# Patient Record
Sex: Female | Born: 1965 | Race: Black or African American | Hispanic: No | Marital: Married | Smoking: Never smoker
Health system: Southern US, Community
[De-identification: ages and names within clinical notes are randomized; demographics above are authoritative.]

## PROBLEM LIST (undated history)

## (undated) DIAGNOSIS — I10 Essential (primary) hypertension: Secondary | ICD-10-CM

## (undated) HISTORY — PX: CHOLECYSTECTOMY: SHX55

## (undated) HISTORY — PX: ABDOMINAL SURGERY: SHX537

---

## 2008-10-24 ENCOUNTER — Emergency Department (HOSPITAL_BASED_OUTPATIENT_CLINIC_OR_DEPARTMENT_OTHER): Admission: EM | Admit: 2008-10-24 | Discharge: 2008-10-24 | Payer: Self-pay | Admitting: Emergency Medicine

## 2010-07-19 ENCOUNTER — Other Ambulatory Visit (HOSPITAL_COMMUNITY): Payer: Self-pay | Admitting: Specialist

## 2010-07-19 DIAGNOSIS — M79641 Pain in right hand: Secondary | ICD-10-CM

## 2010-07-31 ENCOUNTER — Other Ambulatory Visit (HOSPITAL_COMMUNITY): Payer: Self-pay

## 2010-07-31 ENCOUNTER — Ambulatory Visit (HOSPITAL_COMMUNITY)
Admission: RE | Admit: 2010-07-31 | Discharge: 2010-07-31 | Disposition: A | Discharge status: Home / Self Care | Payer: 59 | Source: Ambulatory Visit | Attending: Specialist | Admitting: Specialist

## 2010-07-31 DIAGNOSIS — M79641 Pain in right hand: Secondary | ICD-10-CM

## 2010-07-31 DIAGNOSIS — M19049 Primary osteoarthritis, unspecified hand: Secondary | ICD-10-CM | POA: Insufficient documentation

## 2010-07-31 DIAGNOSIS — M6740 Ganglion, unspecified site: Secondary | ICD-10-CM | POA: Insufficient documentation

## 2010-07-31 DIAGNOSIS — M25549 Pain in joints of unspecified hand: Secondary | ICD-10-CM | POA: Insufficient documentation

## 2010-09-24 LAB — DIFFERENTIAL
Basophils Absolute: 0.1 10*3/uL (ref 0.0–0.1)
Basophils Relative: 1 % (ref 0–1)
Eosinophils Absolute: 0 10*3/uL (ref 0.0–0.7)
Eosinophils Relative: 0 % (ref 0–5)
Lymphocytes Relative: 11 % — ABNORMAL LOW (ref 12–46)

## 2010-09-24 LAB — COMPREHENSIVE METABOLIC PANEL
ALT: 22 U/L (ref 0–35)
AST: 28 U/L (ref 0–37)
Alkaline Phosphatase: 156 U/L — ABNORMAL HIGH (ref 39–117)
CO2: 31 mEq/L (ref 19–32)
Chloride: 98 mEq/L (ref 96–112)
Creatinine, Ser: 1 mg/dL (ref 0.4–1.2)
GFR calc Af Amer: >60 mL/min (ref >60)
GFR calc non Af Amer: >60 mL/min (ref >60)
Total Bilirubin: 0.6 mg/dL (ref 0.3–1.2)

## 2010-09-24 LAB — CBC
HCT: 41.1 % (ref 36.0–46.0)
MCV: 83.9 fL (ref 78.0–100.0)
RBC: 4.9 MIL/uL (ref 3.87–5.11)
WBC: 8.1 10*3/uL (ref 4.0–10.5)

## 2012-10-03 ENCOUNTER — Emergency Department (HOSPITAL_BASED_OUTPATIENT_CLINIC_OR_DEPARTMENT_OTHER)
Admission: EM | Admit: 2012-10-03 | Discharge: 2012-10-03 | Disposition: A | Discharge status: Home / Self Care | Payer: BC Managed Care – PPO | Attending: Emergency Medicine | Admitting: Emergency Medicine

## 2012-10-03 ENCOUNTER — Encounter (HOSPITAL_BASED_OUTPATIENT_CLINIC_OR_DEPARTMENT_OTHER): Payer: Self-pay | Admitting: *Deleted

## 2012-10-03 DIAGNOSIS — R197 Diarrhea, unspecified: Secondary | ICD-10-CM | POA: Insufficient documentation

## 2012-10-03 DIAGNOSIS — B9789 Other viral agents as the cause of diseases classified elsewhere: Secondary | ICD-10-CM | POA: Insufficient documentation

## 2012-10-03 DIAGNOSIS — Z3202 Encounter for pregnancy test, result negative: Secondary | ICD-10-CM | POA: Insufficient documentation

## 2012-10-03 DIAGNOSIS — I10 Essential (primary) hypertension: Secondary | ICD-10-CM | POA: Insufficient documentation

## 2012-10-03 DIAGNOSIS — Z9889 Other specified postprocedural states: Secondary | ICD-10-CM | POA: Insufficient documentation

## 2012-10-03 DIAGNOSIS — Z9089 Acquired absence of other organs: Secondary | ICD-10-CM | POA: Insufficient documentation

## 2012-10-03 DIAGNOSIS — Z79899 Other long term (current) drug therapy: Secondary | ICD-10-CM | POA: Insufficient documentation

## 2012-10-03 DIAGNOSIS — B349 Viral infection, unspecified: Secondary | ICD-10-CM

## 2012-10-03 DIAGNOSIS — R1084 Generalized abdominal pain: Secondary | ICD-10-CM | POA: Insufficient documentation

## 2012-10-03 HISTORY — DX: Essential (primary) hypertension: I10

## 2012-10-03 LAB — CBC WITH DIFFERENTIAL/PLATELET
Eosinophils Relative: 0 % (ref 0–5)
HCT: 35.5 % — ABNORMAL LOW (ref 36.0–46.0)
Hemoglobin: 12.2 g/dL (ref 12.0–15.0)
Lymphocytes Relative: 5 % — ABNORMAL LOW (ref 12–46)
Lymphs Abs: 0.6 10*3/uL — ABNORMAL LOW (ref 0.7–4.0)
MCV: 82.6 fL (ref 78.0–100.0)
Monocytes Absolute: 0.3 10*3/uL (ref 0.1–1.0)
Monocytes Relative: 3 % (ref 3–12)
RBC: 4.3 MIL/uL (ref 3.87–5.11)
RDW: 13.9 % (ref 11.5–15.5)
WBC: 12.1 10*3/uL — ABNORMAL HIGH (ref 4.0–10.5)

## 2012-10-03 LAB — URINALYSIS, ROUTINE W REFLEX MICROSCOPIC
Glucose, UA: NEGATIVE mg/dL
Protein, ur: NEGATIVE mg/dL

## 2012-10-03 LAB — COMPREHENSIVE METABOLIC PANEL
BUN: 10 mg/dL (ref 6–23)
CO2: 29 mEq/L (ref 19–32)
Calcium: 9.5 mg/dL (ref 8.4–10.5)
Creatinine, Ser: 0.9 mg/dL (ref 0.50–1.10)
GFR calc Af Amer: 88 mL/min — ABNORMAL LOW (ref >90)
GFR calc non Af Amer: 76 mL/min — ABNORMAL LOW (ref >90)
Glucose, Bld: 138 mg/dL — ABNORMAL HIGH (ref 70–99)

## 2012-10-03 LAB — URINE MICROSCOPIC-ADD ON

## 2012-10-03 MED ORDER — ONDANSETRON 8 MG PO TBDP
8.0000 mg | ORAL_TABLET | Freq: Once | ORAL | Status: AC
  Administered 2012-10-03: 8 mg via ORAL
  Filled 2012-10-03: qty 1

## 2012-10-03 MED ORDER — ONDANSETRON HCL 4 MG/2ML IJ SOLN
INTRAMUSCULAR | Status: AC
  Administered 2012-10-03: 4 mg via INTRAVENOUS
  Filled 2012-10-03: qty 2

## 2012-10-03 MED ORDER — ONDANSETRON HCL 4 MG/2ML IJ SOLN
4.0000 mg | Freq: Once | INTRAMUSCULAR | Status: AC
  Administered 2012-10-03: 4 mg via INTRAVENOUS

## 2012-10-03 MED ORDER — MORPHINE SULFATE 4 MG/ML IJ SOLN: 4.0000 mg | Freq: Once | INTRAMUSCULAR | Status: DC

## 2012-10-03 MED ORDER — DIPHENOXYLATE-ATROPINE 2.5-0.025 MG PO TABS
2.0000 | ORAL_TABLET | ORAL | Status: AC
  Administered 2012-10-03: 2 via ORAL
  Filled 2012-10-03: qty 2

## 2012-10-03 MED ORDER — SODIUM CHLORIDE 0.9 % IV SOLN
1000.0000 mL | Freq: Once | INTRAVENOUS | Status: AC
Start: 2012-10-03 — End: 2012-10-03
  Administered 2012-10-03: 1000 mL via INTRAVENOUS

## 2012-10-03 MED ORDER — PROMETHAZINE HCL 25 MG/ML IJ SOLN
25.0000 mg | Freq: Once | INTRAMUSCULAR | Status: AC
  Administered 2012-10-03: 25 mg via INTRAVENOUS
  Filled 2012-10-03: qty 1

## 2012-10-03 MED ORDER — DIPHENOXYLATE-ATROPINE 2.5-0.025 MG PO TABS: 1.0000 | ORAL_TABLET | Freq: Four times a day (QID) | ORAL | Status: AC | PRN

## 2012-10-03 MED ORDER — SODIUM CHLORIDE 0.9 % IV BOLUS (SEPSIS): 1000.0000 mL | Freq: Once | INTRAVENOUS | Status: DC

## 2012-10-03 MED ORDER — DIPHENOXYLATE-ATROPINE 2.5-0.025 MG PO TABS
ORAL_TABLET | ORAL | Status: AC
  Filled 2012-10-03: qty 1

## 2012-10-03 MED ORDER — PROMETHAZINE HCL 25 MG PO TABS: 25.0000 mg | ORAL_TABLET | Freq: Four times a day (QID) | ORAL | Status: AC | PRN

## 2012-10-03 NOTE — ED Provider Notes (Signed)
History     CSN: [id number]  Arrival date & time 10/03/12  1712   First MD Initiated Contact with Patient 10/03/12 1847      Chief Complaint  Patient presents with  . Emesis    (Consider location/radiation/quality/duration/timing/severity/associated sxs/prior treatment) HPI Comments: Patient is a 47 year old female who presents with a 1 day history of abdominal pain. The pain is located in her generalized abdomen and does not radiate. The pain is described as cramping and moderate. The pain started gradually and progressively worsened since the onset. No alleviating/aggravating factors. The patient has tried nothing for symptoms without relief. Associated symptoms include NVD. Patient denies fever, headache, chest pain, SOB, dysuria, constipation, abnormal vaginal bleeding/discharge.     Patient is a 47 y.o. female presenting with vomiting.  Emesis Associated symptoms: abdominal pain and diarrhea     Past Medical History  Diagnosis Date  . Hypertension     Past Surgical History  Procedure Laterality Date  . Cholecystectomy    . Abdominal surgery      History reviewed. No pertinent family history.  History  Substance Use Topics  . Smoking status: Never Smoker   . Smokeless tobacco: Not on file  . Alcohol Use: No    OB History   Grav Para Term Preterm Abortions TAB SAB Ect Mult Living                  Review of Systems  Gastrointestinal: Positive for nausea, vomiting, abdominal pain and diarrhea.  All other systems reviewed and are negative.    Allergies  Ace inhibitors  Home Medications   Current Outpatient Rx  Name  Route  Sig  Dispense  Refill  . amLODipine (NORVASC) 5 MG tablet   Oral   Take 5 mg by mouth daily.         . methyldopa (ALDOMET) 250 MG tablet   Oral   Take 250 mg by mouth 2 (two) times daily.           BP 163/79  Pulse 101  Temp(Src) 99.7 F (37.6 C) (Oral)  Resp 20  Ht 5\' 3"  (1.6 m)  Wt 200 lb (90.719 kg)  BMI 35.44  kg/m2  SpO2 97%  LMP 09/12/2012  Physical Exam  Nursing note and vitals reviewed. Constitutional: She is oriented to person, place, and time. She appears well-developed and well-nourished. No distress.  HENT:  Head: Normocephalic and atraumatic.  Eyes: Conjunctivae and EOM are normal. Pupils are equal, round, and reactive to light.  Neck: Normal range of motion.  Cardiovascular: Normal rate and regular rhythm.  Exam reveals no gallop and no friction rub.   No murmur heard. Pulmonary/Chest: Effort normal and breath sounds normal. She has no wheezes. She has no rales. She exhibits no tenderness.  Abdominal: Soft. She exhibits no distension. There is tenderness. There is no rebound and no guarding.  Mild generalized tenderness to palpation.   Musculoskeletal: Normal range of motion.  Neurological: She is alert and oriented to person, place, and time. Coordination normal.  Speech is goal-oriented. Moves limbs without ataxia.   Skin: Skin is warm and dry.  Psychiatric: She has a normal mood and affect. Her behavior is normal.    ED Course  Procedures (including critical care time)  Labs Reviewed  URINALYSIS, ROUTINE W REFLEX MICROSCOPIC - Abnormal; Notable for the following:    APPearance CLOUDY (*)    Hgb urine dipstick SMALL (*)    All other components within  normal limits  CBC WITH DIFFERENTIAL - Abnormal; Notable for the following:    WBC 12.1 (*)    HCT 35.5 (*)    Platelets 428 (*)    Neutrophils Relative 92 (*)    Neutro Abs 11.1 (*)    Lymphocytes Relative 5 (*)    Lymphs Abs 0.6 (*)    All other components within normal limits  COMPREHENSIVE METABOLIC PANEL - Abnormal; Notable for the following:    Potassium 3.1 (*)    Glucose, Bld 138 (*)    GFR calc non Af Amer 76 (*)    GFR calc Af Amer 88 (*)    All other components within normal limits  URINE MICROSCOPIC-ADD ON - Abnormal; Notable for the following:    Squamous Epithelial / LPF FEW (*)    Bacteria, UA FEW (*)     Casts HYALINE CASTS (*)    All other components within normal limits  PREGNANCY, URINE  LIPASE, BLOOD   No results found.   1. Viral illness       MDM  9:11 PM Labs and urinalysis unremarkable. Patient likely has a viral illness. Patient will be treated symptomatically with zofran, lomotil, and fluids. Vitals stable and patient is afebrile. Patient will be discharged with phenergan and lomotil for symptoms. Patient instructed to return with worsening or concerning symptoms.        Emilia Beck, [place] 10/03/12 2116

## 2012-10-03 NOTE — ED Notes (Signed)
Pt states her family member was sick with these s/s on Friday and today she began having N/V/D

## 2012-10-03 NOTE — ED Notes (Signed)
Pt states that she cannot provide a urine specimen at this time.  Will check back shortly.    

## 2012-10-04 NOTE — ED Provider Notes (Signed)
Medical screening examination/treatment/procedure(s) were performed by non-physician practitioner and as supervising physician I was immediately available for consultation/collaboration.   Ruger Saxer III, MD 10/04/12 1446 

## 2014-04-22 ENCOUNTER — Emergency Department (HOSPITAL_BASED_OUTPATIENT_CLINIC_OR_DEPARTMENT_OTHER): Payer: BC Managed Care – PPO

## 2014-04-22 ENCOUNTER — Emergency Department (HOSPITAL_BASED_OUTPATIENT_CLINIC_OR_DEPARTMENT_OTHER)
Admission: EM | Admit: 2014-04-22 | Discharge: 2014-04-22 | Disposition: A | Discharge status: Home / Self Care | Payer: BC Managed Care – PPO | Attending: Emergency Medicine | Admitting: Emergency Medicine

## 2014-04-22 ENCOUNTER — Encounter (HOSPITAL_BASED_OUTPATIENT_CLINIC_OR_DEPARTMENT_OTHER): Payer: Self-pay

## 2014-04-22 DIAGNOSIS — R1031 Right lower quadrant pain: Secondary | ICD-10-CM | POA: Diagnosis present

## 2014-04-22 DIAGNOSIS — N946 Dysmenorrhea, unspecified: Secondary | ICD-10-CM | POA: Insufficient documentation

## 2014-04-22 DIAGNOSIS — Z79899 Other long term (current) drug therapy: Secondary | ICD-10-CM | POA: Insufficient documentation

## 2014-04-22 DIAGNOSIS — I10 Essential (primary) hypertension: Secondary | ICD-10-CM | POA: Diagnosis not present

## 2014-04-22 DIAGNOSIS — Z3202 Encounter for pregnancy test, result negative: Secondary | ICD-10-CM | POA: Diagnosis not present

## 2014-04-22 DIAGNOSIS — Z9089 Acquired absence of other organs: Secondary | ICD-10-CM | POA: Diagnosis not present

## 2014-04-22 DIAGNOSIS — R102 Pelvic and perineal pain: Secondary | ICD-10-CM

## 2014-04-22 DIAGNOSIS — R109 Unspecified abdominal pain: Secondary | ICD-10-CM

## 2014-04-22 DIAGNOSIS — Z9889 Other specified postprocedural states: Secondary | ICD-10-CM | POA: Insufficient documentation

## 2014-04-22 LAB — CBC WITH DIFFERENTIAL/PLATELET
Basophils Absolute: 0.1 10*3/uL (ref 0.0–0.1)
Basophils Relative: 1 % (ref 0–1)
Eosinophils Absolute: 0.2 10*3/uL (ref 0.0–0.7)
Eosinophils Relative: 3 % (ref 0–5)
HCT: 32.5 % — ABNORMAL LOW (ref 36.0–46.0)
Hemoglobin: 11.2 g/dL — ABNORMAL LOW (ref 12.0–15.0)
Lymphocytes Relative: 51 % — ABNORMAL HIGH (ref 12–46)
Lymphs Abs: 2.9 10*3/uL (ref 0.7–4.0)
MCH: 28.9 pg (ref 26.0–34.0)
MCHC: 34.5 g/dL (ref 30.0–36.0)
MCV: 83.8 fL (ref 78.0–100.0)
Monocytes Absolute: 0.5 10*3/uL (ref 0.1–1.0)
Monocytes Relative: 8 % (ref 3–12)
Neutro Abs: 2.1 10*3/uL (ref 1.7–7.7)
Neutrophils Relative %: 37 % — ABNORMAL LOW (ref 43–77)
Platelets: 426 10*3/uL — ABNORMAL HIGH (ref 150–400)
RBC: 3.88 MIL/uL (ref 3.87–5.11)
RDW: 13.5 % (ref 11.5–15.5)
WBC: 5.6 10*3/uL (ref 4.0–10.5)

## 2014-04-22 LAB — URINE MICROSCOPIC-ADD ON

## 2014-04-22 LAB — COMPREHENSIVE METABOLIC PANEL
ALT: 14 U/L (ref 0–35)
ANION GAP: 12 (ref 5–15)
AST: 18 U/L (ref 0–37)
Albumin: 3.6 g/dL (ref 3.5–5.2)
Alkaline Phosphatase: 79 U/L (ref 39–117)
BUN: 12 mg/dL (ref 6–23)
CALCIUM: 9 mg/dL (ref 8.4–10.5)
CO2: 28 meq/L (ref 19–32)
CREATININE: 0.9 mg/dL (ref 0.50–1.10)
Chloride: 103 mEq/L (ref 96–112)
GFR, EST AFRICAN AMERICAN: 88 mL/min — AB (ref >90)
GFR, EST NON AFRICAN AMERICAN: 76 mL/min — AB (ref >90)
GLUCOSE: 105 mg/dL — AB (ref 70–99)
Potassium: 3.5 mEq/L — ABNORMAL LOW (ref 3.7–5.3)
Sodium: 143 mEq/L (ref 137–147)
Total Bilirubin: <0.2 mg/dL — ABNORMAL LOW (ref 0.3–1.2)
Total Protein: 7.4 g/dL (ref 6.0–8.3)

## 2014-04-22 LAB — URINALYSIS, ROUTINE W REFLEX MICROSCOPIC
Bilirubin Urine: NEGATIVE
GLUCOSE, UA: NEGATIVE mg/dL
KETONES UR: NEGATIVE mg/dL
LEUKOCYTES UA: NEGATIVE
Nitrite: NEGATIVE
PROTEIN: NEGATIVE mg/dL
Specific Gravity, Urine: 1.013 (ref 1.005–1.030)
UROBILINOGEN UA: 0.2 mg/dL (ref 0.0–1.0)
pH: 6.5 (ref 5.0–8.0)

## 2014-04-22 LAB — WET PREP, GENITAL
Trich, Wet Prep: NONE SEEN
YEAST WET PREP: NONE SEEN

## 2014-04-22 LAB — PREGNANCY, URINE: Preg Test, Ur: NEGATIVE

## 2014-04-22 MED ORDER — KETOROLAC TROMETHAMINE 30 MG/ML IJ SOLN
30.0000 mg | Freq: Once | INTRAMUSCULAR | Status: AC
  Administered 2014-04-22: 30 mg via INTRAVENOUS
  Filled 2014-04-22: qty 1

## 2014-04-22 MED ORDER — NAPROXEN 500 MG PO TABS: 500.0000 mg | ORAL_TABLET | Freq: Two times a day (BID) | ORAL | Status: AC

## 2014-04-22 MED ORDER — PROMETHAZINE HCL 25 MG/ML IJ SOLN
12.5000 mg | Freq: Once | INTRAMUSCULAR | Status: DC
  Filled 2014-04-22: qty 1

## 2014-04-22 NOTE — ED Notes (Signed)
RLQ pain since yesterday afternoon.  Chills, no fever. No N/V/D

## 2014-04-22 NOTE — ED Notes (Signed)
Pelvic cart is at the bedside set up and ready for the doctor to use. 

## 2014-04-22 NOTE — ED Notes (Signed)
Pt states she doesn't want Phenergan at this time. Danna HeftyGolden, Jeremiyah Cullens Lee, RN

## 2014-04-22 NOTE — ED Notes (Signed)
Waiting on pregnancy test results before doing cat scan.  Thanks

## 2014-04-22 NOTE — Discharge Instructions (Signed)
Follow up with your GYN 

## 2014-04-22 NOTE — ED Notes (Signed)
Waiting on pregnancy results for ct scan.  Thanks

## 2014-04-22 NOTE — ED Provider Notes (Signed)
CSN: [id number]     Arrival date & time 04/22/14  1330 History   First MD Initiated Contact with Patient 04/22/14 1521     Chief Complaint  Patient presents with  . Abdominal Pain     (Consider location/radiation/quality/duration/timing/severity/associated sxs/prior Treatment) Patient is a 48 y.o. female presenting with abdominal pain. The history is provided by the patient.  Abdominal Pain Pain location:  RLQ Pain quality: aching, sharp and stabbing   Pain radiates to:  R flank Pain severity:  Severe Onset quality:  Gradual Duration:  24 hours Timing:  Constant Progression:  Worsening Chronicity:  New Relieved by:  Nothing Worsened by:  Nothing tried  Shelly Pena is a 48 y.o. female who presents to the ED with RLQ abdominal pain that started yesterday. She states the pain started in her lower abdomen and now has increased and is in the RLQ. She denies n/v, fever or chills or other problems. The pain increases with ridding over bumps in the road or jumping. She denies UTI symptoms. Menses currently.  Past Medical History  Diagnosis Date  . Hypertension    Past Surgical History  Procedure Laterality Date  . Cholecystectomy    . Abdominal surgery     No family history on file. History  Substance Use Topics  . Smoking status: Never Smoker   . Smokeless tobacco: Not on file  . Alcohol Use: No   OB History    No data available     Review of Systems  Gastrointestinal: Positive for abdominal pain.  all other systems negative    Allergies  Ace inhibitors and Losartan  Home Medications   Prior to Admission medications   Medication Sig Start Date End Date Taking? Authorizing Provider  amLODipine (NORVASC) 5 MG tablet Take 5 mg by mouth daily.    Historical Provider, MD  diphenoxylate-atropine (LOMOTIL) 2.5-0.025 MG per tablet Take 1 tablet by mouth 4 (four) times daily as needed for diarrhea or loose stools. 10/03/12   Kaitlyn Szekalski, PA-C  methyldopa  (ALDOMET) 250 MG tablet Take 250 mg by mouth 2 (two) times daily.    Historical Provider, MD  promethazine (PHENERGAN) 25 MG tablet Take 1 tablet (25 mg total) by mouth every 6 (six) hours as needed for nausea. 10/03/12   Kaitlyn Szekalski, PA-C   BP 150/83 mmHg  Pulse 83  Temp(Src) 98.4 F (36.9 C) (Oral)  Resp 18  Ht 5\' 3"  (1.6 m)  Wt 209 lb (94.802 kg)  BMI 37.03 kg/m2  SpO2 99%  LMP 04/17/2014 Physical Exam  Constitutional: She is oriented to person, place, and time. She appears well-developed and well-nourished.  HENT:  Head: Normocephalic.  Eyes: EOM are normal.  Neck: Neck supple.  Cardiovascular: Normal rate.   Pulmonary/Chest: Effort normal.  Abdominal: Soft. Bowel sounds are normal. There is tenderness in the right lower quadrant. There is no rigidity, no rebound, no guarding and no CVA tenderness.  Genitourinary:  External genitalia without lesions, moderate blood vaginal vault. Mild CMT, right adnexal tenderness, uterus without palpable enlargement.   Musculoskeletal: Normal range of motion.  Neurological: She is alert and oriented to person, place, and time. No cranial nerve deficit.  Skin: Skin is warm and dry.  Psychiatric: She has a normal mood and affect. Her behavior is normal.  Nursing note and vitals reviewed.   ED Course  Procedures (including critical care time) Labs Review Patient has vaginal bleeding that most likely reason for positive blood in urine.  Results for orders  placed or performed during the hospital encounter of 04/22/14 (from the past 24 hour(s))  Urinalysis with microscopic     Status: Abnormal   Collection Time: 04/22/14  2:02 PM  Result Value Ref Range   Color, Urine YELLOW YELLOW   APPearance CLEAR CLEAR   Specific Gravity, Urine 1.013 1.005 - 1.030   pH 6.5 5.0 - 8.0   Glucose, UA NEGATIVE NEGATIVE mg/dL   Hgb urine dipstick LARGE (A) NEGATIVE   Bilirubin Urine NEGATIVE NEGATIVE   Ketones, ur NEGATIVE NEGATIVE mg/dL   Protein, ur  NEGATIVE NEGATIVE mg/dL   Urobilinogen, UA 0.2 0.0 - 1.0 mg/dL   Nitrite NEGATIVE NEGATIVE   Leukocytes, UA NEGATIVE NEGATIVE  Urine microscopic-add on     Status: None   Collection Time: 04/22/14  2:02 PM  Result Value Ref Range   Squamous Epithelial / LPF RARE RARE   RBC / HPF 7-10 <3 RBC/hpf   Bacteria, UA RARE RARE  Pregnancy, urine     Status: None   Collection Time: 04/22/14  3:38 PM  Result Value Ref Range   Preg Test, Ur NEGATIVE NEGATIVE  CBC with Differential     Status: Abnormal   Collection Time: 04/22/14  3:45 PM  Result Value Ref Range   WBC 5.6 4.0 - 10.5 K/uL   RBC 3.88 3.87 - 5.11 MIL/uL   Hemoglobin 11.2 (L) 12.0 - 15.0 g/dL   HCT 60.632.5 (L) 30.136.0 - 60.146.0 %   MCV 83.8 78.0 - 100.0 fL   MCH 28.9 26.0 - 34.0 pg   MCHC 34.5 30.0 - 36.0 g/dL   RDW 09.313.5 23.511.5 - 57.315.5 %   Platelets 426 (H) 150 - 400 K/uL   Neutrophils Relative % 37 (L) 43 - 77 %   Neutro Abs 2.1 1.7 - 7.7 K/uL   Lymphocytes Relative 51 (H) 12 - 46 %   Lymphs Abs 2.9 0.7 - 4.0 K/uL   Monocytes Relative 8 3 - 12 %   Monocytes Absolute 0.5 0.1 - 1.0 K/uL   Eosinophils Relative 3 0 - 5 %   Eosinophils Absolute 0.2 0.0 - 0.7 K/uL   Basophils Relative 1 0 - 1 %   Basophils Absolute 0.1 0.0 - 0.1 K/uL  Comprehensive metabolic panel     Status: Abnormal   Collection Time: 04/22/14  3:45 PM  Result Value Ref Range   Sodium 143 137 - 147 mEq/L   Potassium 3.5 (L) 3.7 - 5.3 mEq/L   Chloride 103 96 - 112 mEq/L   CO2 28 19 - 32 mEq/L   Glucose, Bld 105 (H) 70 - 99 mg/dL   BUN 12 6 - 23 mg/dL   Creatinine, Ser 2.200.90 0.50 - 1.10 mg/dL   Calcium 9.0 8.4 - 25.410.5 mg/dL   Total Protein 7.4 6.0 - 8.3 g/dL   Albumin 3.6 3.5 - 5.2 g/dL   AST 18 0 - 37 U/L   ALT 14 0 - 35 U/L   Alkaline Phosphatase 79 39 - 117 U/L   Total Bilirubin <0.2 (L) 0.3 - 1.2 mg/dL   GFR calc non Af Amer 76 (L) >90 mL/min   GFR calc Af Amer 88 (L) >90 mL/min   Anion gap 12 5 - 15    Ct Renal Stone Study  04/22/2014   CLINICAL DATA:   Right lower quadrant abdominal pain x1 day, chills, prior cholecystectomy  EXAM: CT ABDOMEN AND PELVIS WITHOUT CONTRAST  TECHNIQUE: Multidetector CT imaging of the abdomen and pelvis  was performed following the standard protocol without IV contrast.  COMPARISON:  None.  FINDINGS: Lower chest:  Lung bases are clear.  Hepatobiliary: Mild hepatic steatosis.  Cholecystectomy clips.  Pancreas: Within normal limits.  Spleen: Within normal limits.  Adrenals/Urinary Tract: Adrenal glands are unremarkable.  3 mm nonobstructing right lower pole renal calculus (series 5/image 63). Left kidney is within normal limits.  No ureteral or bladder calculi.  Bladder is within normal limits.  Stomach/Bowel: Stomach is unremarkable.  No evidence of bowel obstruction.  Normal appendix.  Vascular/Lymphatic: No evidence of abdominal aortic aneurysm.  12 mm short axis left para-aortic node (series 2/image 41), mildly prominent, but likely reactive.  Reproductive: Uterus is unremarkable.  Bilateral ovaries are within normal limits.  Other: No abdominopelvic ascites.  Musculoskeletal: Visualized osseous structures are within normal limits.  IMPRESSION: 3 mm nonobstructing right lower pole renal calculus.  No ureteral or bladder calculi.  No hydronephrosis.  Normal appendix.  No evidence of bowel obstruction.  Prior cholecystectomy.  Mild hepatic steatosis.   Electronically Signed   By: Charline Bills M.D.   On: 04/22/2014 17:11    MDM  48 y.o. female with lower abdominal pain with menses. Pain improved significantly with Toradol.I have reviewed this patient's vital signs, nurses notes, appropriate labs and imaging.  I have discussed findings and plan of care with the patient and she voices understanding and agrees with plan. She will follow up with her GYN. She will return here as needed.    Medication List    TAKE these medications        naproxen 500 MG tablet  Commonly known as:  NAPROSYN  Take 1 tablet (500 mg total) by  mouth 2 (two) times daily.      ASK your doctor about these medications        amLODipine 5 MG tablet  Commonly known as:  NORVASC  Take 5 mg by mouth daily.     diphenoxylate-atropine 2.5-0.025 MG per tablet  Commonly known as:  LOMOTIL  Take 1 tablet by mouth 4 (four) times daily as needed for diarrhea or loose stools.     methyldopa 250 MG tablet  Commonly known as:  ALDOMET  Take 250 mg by mouth 2 (two) times daily.     promethazine 25 MG tablet  Commonly known as:  PHENERGAN  Take 1 tablet (25 mg total) by mouth every 6 (six) hours as needed for nausea.            [place], NP 04/22/14 2202  Nelia Shi, MD 04/23/14 1745  Nelia Shi, MD 04/23/14 [id number]

## 2014-04-24 LAB — GC/CHLAMYDIA PROBE AMP
CT Probe RNA: NEGATIVE
GC Probe RNA: NEGATIVE

## 2015-11-04 IMAGING — CT CT RENAL STONE PROTOCOL
2 of 4 series · 16 of 46 positions shown, 18 images · non-contrast
Comparison: None.

CLINICAL DATA: Right lower quadrant abdominal pain x1 day, chills,
prior cholecystectomy

EXAM:
CT ABDOMEN AND PELVIS WITHOUT CONTRAST
TECHNIQUE: Multidetector CT imaging of the abdomen and pelvis was performed
following the standard protocol without IV contrast.

[Series 2: renal stone > 200 lbs 5.0 b31f · axial · 0.80mm/px · z∈[+562,+967]mm · 13 of 89 slices shown, 15 images]
[im 4/89  soft-tissue]
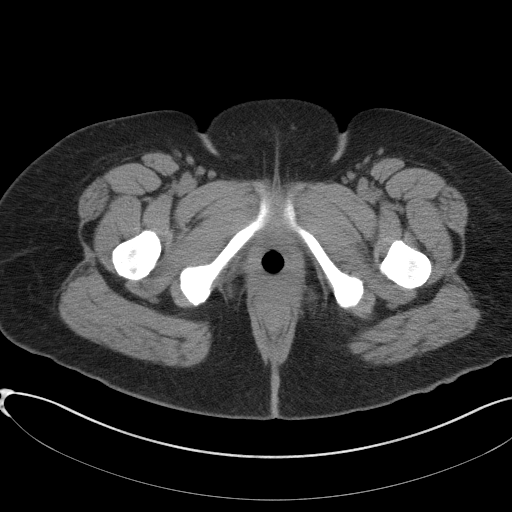
[im 4/89  bone]
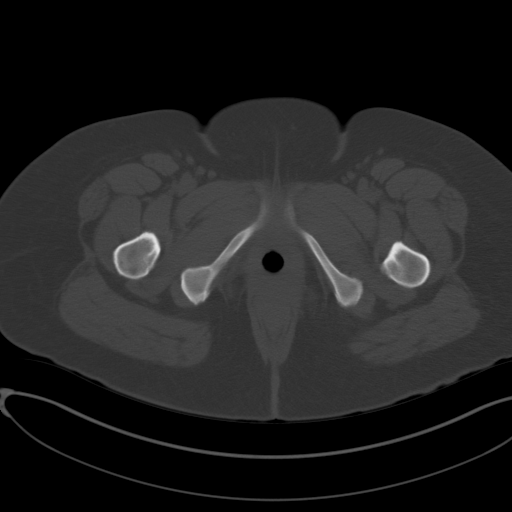
[im 11/89  soft-tissue]
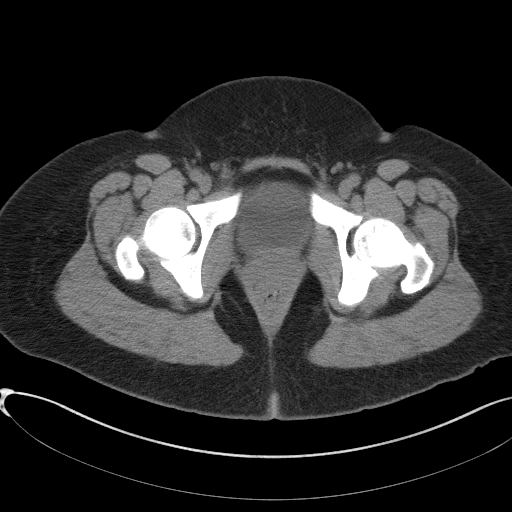
[im 17/89  soft-tissue]
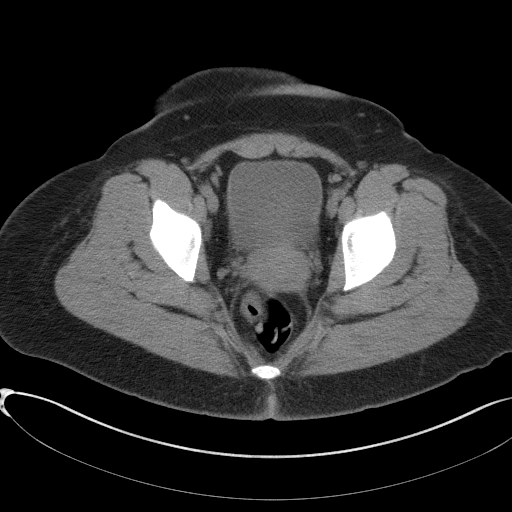
[im 24/89  soft-tissue]
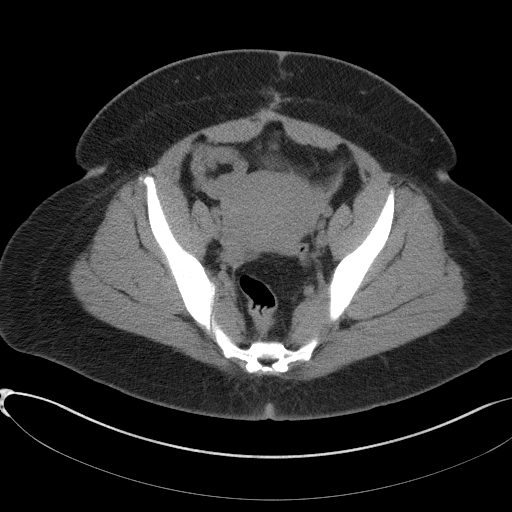
[im 31/89  soft-tissue]
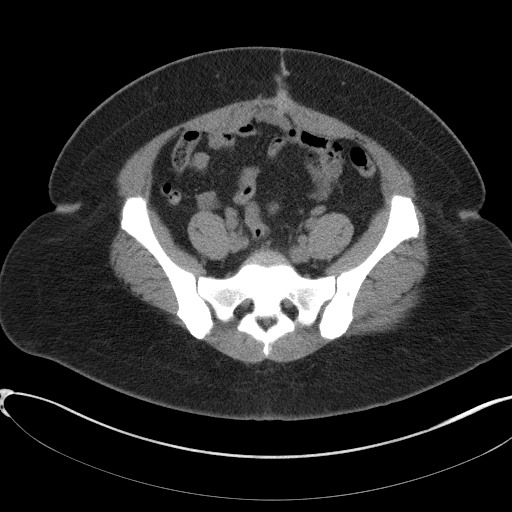
[im 38/89  soft-tissue]
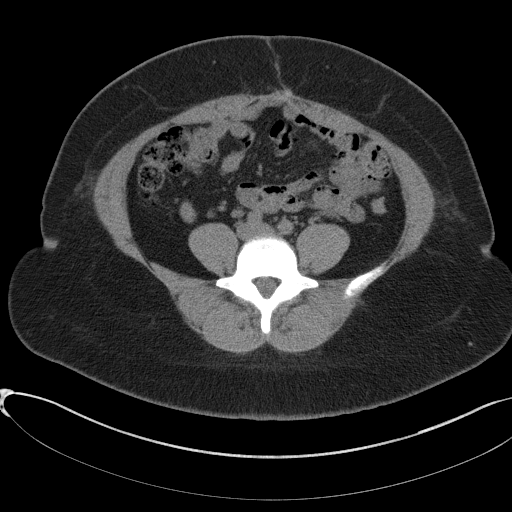
[im 45/89  soft-tissue]
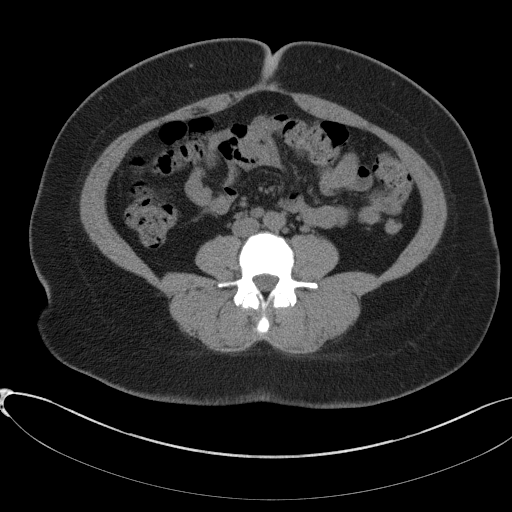
[im 51/89  soft-tissue]
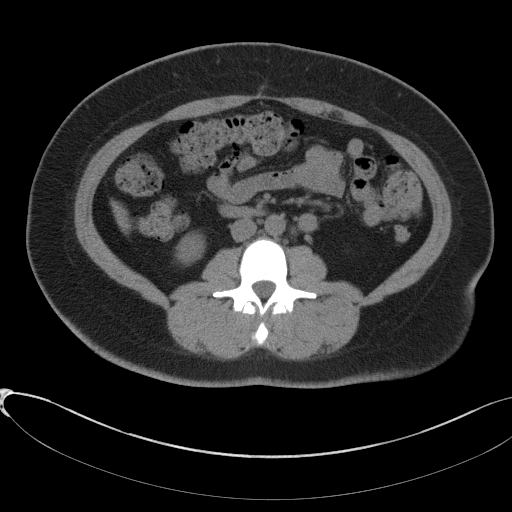
[im 58/89  soft-tissue]
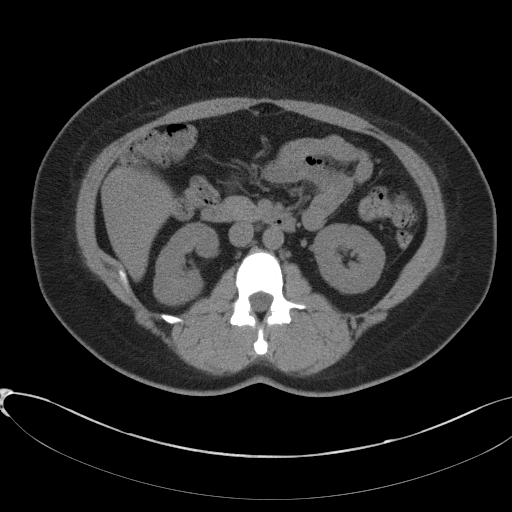
[im 58/89  bone]
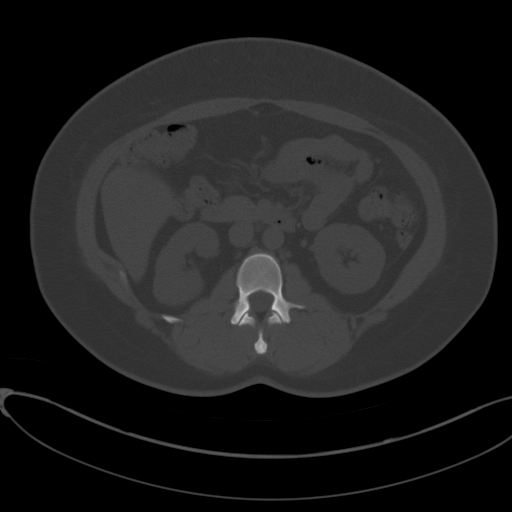
[im 65/89  soft-tissue]
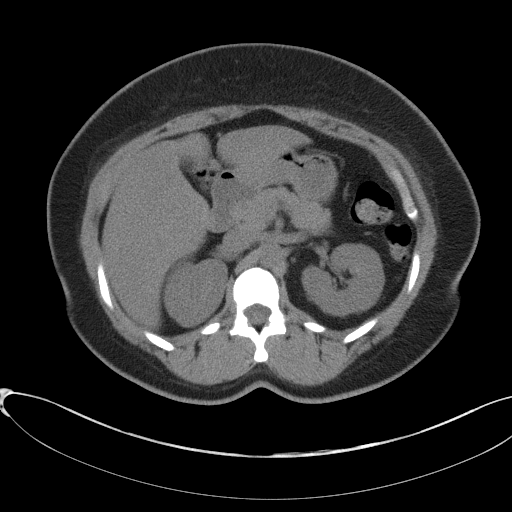
[im 72/89  soft-tissue]
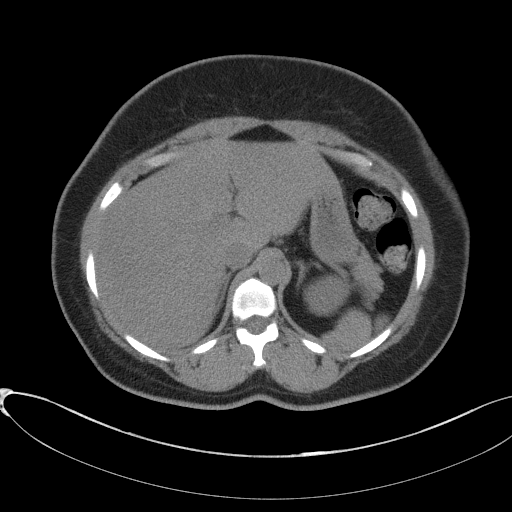
[im 78/89  soft-tissue]
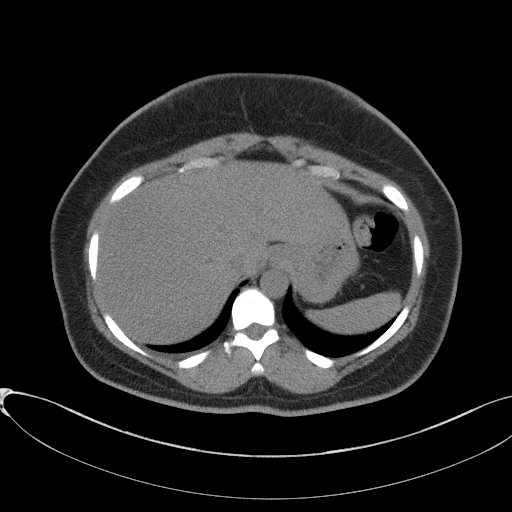
[im 85/89  soft-tissue]
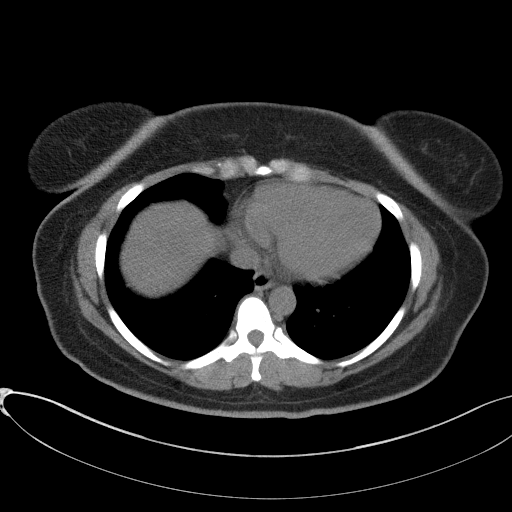

[Series 5: renal stone 3.0 coronal · coronal · 0.94mm/px · 3 of 96 slices shown]
[im 32/96  soft-tissue]
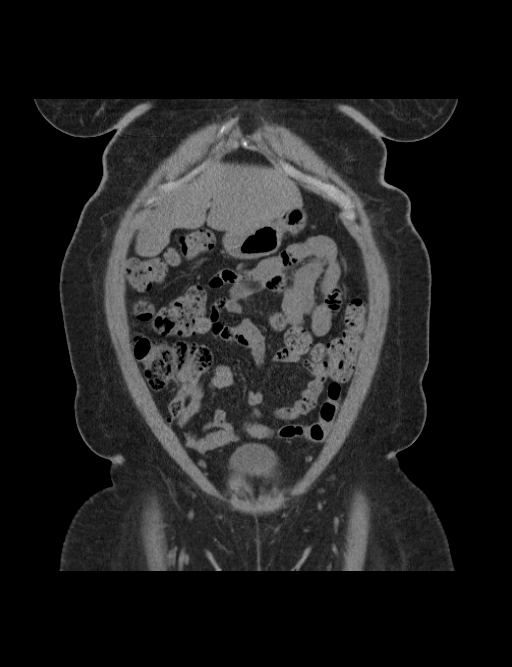
[im 43/96  soft-tissue]
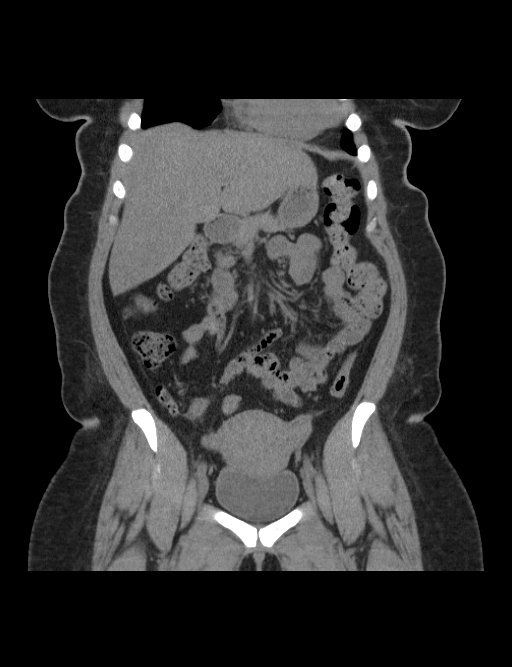
[im 53/96  soft-tissue]
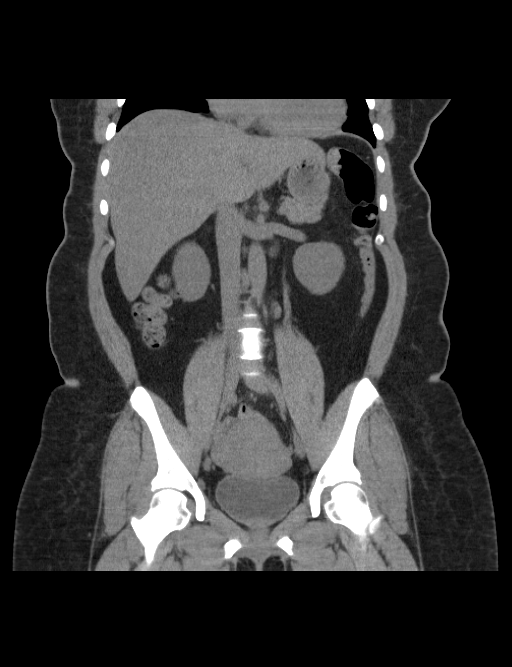

[16 of 46 positions shown; findings below may reference images not displayed]

FINDINGS: Lower chest:  Lung bases are clear.

Hepatobiliary: Mild hepatic steatosis.

Cholecystectomy clips.

Pancreas: Within normal limits.

Spleen: Within normal limits.

Adrenals/Urinary Tract: Adrenal glands are unremarkable.

3 mm nonobstructing right lower pole renal calculus (series 5/image
63). Left kidney is within normal limits.

No ureteral or bladder calculi.

Bladder is within normal limits.

Stomach/Bowel: Stomach is unremarkable.

No evidence of bowel obstruction.

Normal appendix.

Vascular/Lymphatic: No evidence of abdominal aortic aneurysm.

12 mm short axis left para-aortic node (series 2/image 41), mildly
prominent, but likely reactive.

Reproductive: Uterus is unremarkable.

Bilateral ovaries are within normal limits.

Other: No abdominopelvic ascites.

Musculoskeletal: Visualized osseous structures are within normal
limits.
IMPRESSION: 3 mm nonobstructing right lower pole renal calculus.

No ureteral or bladder calculi.  No hydronephrosis.

Normal appendix.  No evidence of bowel obstruction.

Prior cholecystectomy.

Mild hepatic steatosis.
